# Patient Record
Sex: Male | Born: 1945
Health system: Southern US, Community
[De-identification: ages and names within clinical notes are randomized; demographics above are authoritative.]

## PROBLEM LIST (undated history)

## (undated) DIAGNOSIS — I219 Acute myocardial infarction, unspecified: Secondary | ICD-10-CM

## (undated) DIAGNOSIS — I2510 Atherosclerotic heart disease of native coronary artery without angina pectoris: Secondary | ICD-10-CM

## (undated) HISTORY — PX: CORONARY STENT PLACEMENT: SHX1402

## (undated) HISTORY — PX: CORONARY ANGIOPLASTY: SHX604

## (undated) HISTORY — PX: HERNIA REPAIR: SHX51

---

## 2010-01-24 ENCOUNTER — Ambulatory Visit: Payer: Self-pay | Admitting: Diagnostic Radiology

## 2010-01-24 ENCOUNTER — Emergency Department (HOSPITAL_BASED_OUTPATIENT_CLINIC_OR_DEPARTMENT_OTHER): Admission: EM | Admit: 2010-01-24 | Discharge: 2010-01-24 | Payer: Self-pay | Admitting: Emergency Medicine

## 2012-04-26 ENCOUNTER — Encounter (HOSPITAL_BASED_OUTPATIENT_CLINIC_OR_DEPARTMENT_OTHER): Payer: Self-pay | Admitting: Emergency Medicine

## 2012-04-26 ENCOUNTER — Emergency Department (HOSPITAL_BASED_OUTPATIENT_CLINIC_OR_DEPARTMENT_OTHER)
Admission: EM | Admit: 2012-04-26 | Discharge: 2012-04-26 | Disposition: A | Discharge status: Home / Self Care | Payer: BC Managed Care – PPO | Attending: Emergency Medicine | Admitting: Emergency Medicine

## 2012-04-26 DIAGNOSIS — Z9861 Coronary angioplasty status: Secondary | ICD-10-CM | POA: Insufficient documentation

## 2012-04-26 DIAGNOSIS — I252 Old myocardial infarction: Secondary | ICD-10-CM | POA: Insufficient documentation

## 2012-04-26 DIAGNOSIS — I2510 Atherosclerotic heart disease of native coronary artery without angina pectoris: Secondary | ICD-10-CM | POA: Insufficient documentation

## 2012-04-26 DIAGNOSIS — N390 Urinary tract infection, site not specified: Secondary | ICD-10-CM | POA: Insufficient documentation

## 2012-04-26 HISTORY — DX: Acute myocardial infarction, unspecified: I21.9

## 2012-04-26 HISTORY — DX: Atherosclerotic heart disease of native coronary artery without angina pectoris: I25.10

## 2012-04-26 LAB — URINALYSIS, ROUTINE W REFLEX MICROSCOPIC: Glucose, UA: NEGATIVE mg/dL

## 2012-04-26 LAB — URINE MICROSCOPIC-ADD ON

## 2012-04-26 MED ORDER — CIPROFLOXACIN HCL 500 MG PO TABS: 500.0000 mg | ORAL_TABLET | Freq: Two times a day (BID) | ORAL | Status: AC

## 2012-04-26 MED ORDER — CIPROFLOXACIN HCL 500 MG PO TABS
500.0000 mg | ORAL_TABLET | Freq: Once | ORAL | Status: AC
  Administered 2012-04-26: 500 mg via ORAL
  Filled 2012-04-26: qty 1

## 2012-04-26 NOTE — ED Provider Notes (Signed)
History     CSN: [id number]  Arrival date & time 04/26/12  4098   First MD Initiated Contact with Patient 04/26/12 [phone]      Chief Complaint  Patient presents with  . Urinary Tract Infection    (Consider location/radiation/quality/duration/timing/severity/associated sxs/prior treatment) HPI Comments: Patient presents with dysuria and urgency symptoms that began yesterday afternoon.  He states this is similar to when he had a prior urinary tract infection.  He denies any nausea or vomiting.  No fevers.  No abdominal pain.  Patient otherwise feels well.  Patient is a 66 y.o. male presenting with urinary tract infection. The history is provided by the patient. No language interpreter was used.  Urinary Tract Infection This is a new problem. Pertinent negatives include no chest pain, no abdominal pain, no headaches and no shortness of breath.    Past Medical History  Diagnosis Date  . Coronary artery disease   . Myocardial infarct     Past Surgical History  Procedure Date  . Coronary angioplasty   . Coronary stent placement     History reviewed. No pertinent family history.  History  Substance Use Topics  . Smoking status: Not on file  . Smokeless tobacco: Not on file  . Alcohol Use:       Review of Systems  Constitutional: Negative.  Negative for fever and chills.  HENT: Negative.   Respiratory: Negative.  Negative for shortness of breath.   Cardiovascular: Negative.  Negative for chest pain.  Gastrointestinal: Negative.  Negative for nausea, vomiting and abdominal pain.  Genitourinary: Positive for hematuria. Negative for flank pain.  Musculoskeletal: Negative.  Negative for back pain.  Skin: Negative.  Negative for color change and rash.  Neurological: Negative for headaches.  Hematological: Negative.  Negative for adenopathy.  Psychiatric/Behavioral: Negative.  Negative for confusion.  All other systems reviewed and are negative.    Allergies  Review of  patient's allergies indicates no known allergies.  Home Medications   Current Outpatient Rx  Name Route Sig Dispense Refill  . EZETIMIBE-SIMVASTATIN 10-10 MG PO TABS Oral Take 1 tablet by mouth at bedtime.    Marland Kitchen METOPROLOL SUCCINATE ER 50 MG PO TB24 Oral Take 50 mg by mouth daily. Take with or immediately following a meal.    . CIPROFLOXACIN HCL 500 MG PO TABS Oral Take 1 tablet (500 mg total) by mouth every 12 (twelve) hours. 28 tablet 0    BP 140/79  Pulse 68  Temp 97.7 F (36.5 C) (Oral)  Resp 16  SpO2 100%  Physical Exam  Nursing note and vitals reviewed. Constitutional: He is oriented to person, place, and time. He appears well-developed and well-nourished.  Non-toxic appearance. He does not have a sickly appearance.  HENT:  Head: Normocephalic and atraumatic.  Eyes: Conjunctivae, EOM and lids are normal. Pupils are equal, round, and reactive to light.  Neck: Trachea normal, normal range of motion and full passive range of motion without pain. Neck supple.  Cardiovascular: Normal rate.   Pulmonary/Chest: Effort normal.  Abdominal: Soft. Normal appearance. He exhibits no distension. There is no tenderness. There is no rebound and no CVA tenderness.  Musculoskeletal: Normal range of motion.  Neurological: He is alert and oriented to person, place, and time. He has normal strength.  Skin: Skin is warm, dry and intact. No rash noted.  Psychiatric: He has a normal mood and affect. His behavior is normal. Judgment and thought content normal.    ED Course  Procedures (including  critical care time)  Labs Reviewed  URINALYSIS, ROUTINE W REFLEX MICROSCOPIC - Abnormal; Notable for the following:    Color, Urine AMBER (*)  BIOCHEMICALS MAY BE AFFECTED BY COLOR   Hgb urine dipstick LARGE (*)     Protein, ur 100 (*)     Nitrite POSITIVE (*)     Leukocytes, UA SMALL (*)     All other components within normal limits  URINE MICROSCOPIC-ADD ON - Abnormal; Notable for the following:     Bacteria, UA MANY (*)     All other components within normal limits  URINE CULTURE   No results found.   1. UTI (urinary tract infection)       MDM  Patient with urinary tract infection symptoms consistent with his urinalysis today.  Patient is still able to urinate without difficulty.  He has normal vital signs and is tolerating by mouth intake.  The patient is safe for discharge home with followup with his primary care physician or urology in approximately one week.  Patient is comfortable with this plan.  He'll be discharged with ciprofloxacin 500 mg twice a day for 2 weeks.        Nat Christen, MD 04/26/12 [phone]

## 2012-04-26 NOTE — ED Notes (Signed)
Pt c/o burning urination since last night.  No known fever.

## 2012-04-29 LAB — URINE CULTURE: Colony Count: >=100000

## 2012-04-30 NOTE — ED Notes (Signed)
+   urine  Patient treated appropriately -sensitive to same-chart appended per protocol MD.  

## 2012-05-26 ENCOUNTER — Other Ambulatory Visit: Payer: Self-pay | Admitting: Urology

## 2012-05-26 DIAGNOSIS — N419 Inflammatory disease of prostate, unspecified: Secondary | ICD-10-CM

## 2012-05-28 ENCOUNTER — Ambulatory Visit
Admission: RE | Admit: 2012-05-28 | Discharge: 2012-05-28 | Disposition: A | Discharge status: Home / Self Care | Payer: BC Managed Care – PPO | Source: Ambulatory Visit | Attending: Urology | Admitting: Urology

## 2012-05-28 DIAGNOSIS — N419 Inflammatory disease of prostate, unspecified: Secondary | ICD-10-CM

## 2012-09-17 IMAGING — US US RENAL
1 series · 14 of 25 positions shown · non-contrast
Comparison: None

CLINICAL DATA: Recurrent prostatitis

RENAL/URINARY TRACT ULTRASOUND COMPLETE

[Series 1: us renal · 0.25mm/px · 14 of 29 slices shown]
[im 1/29]
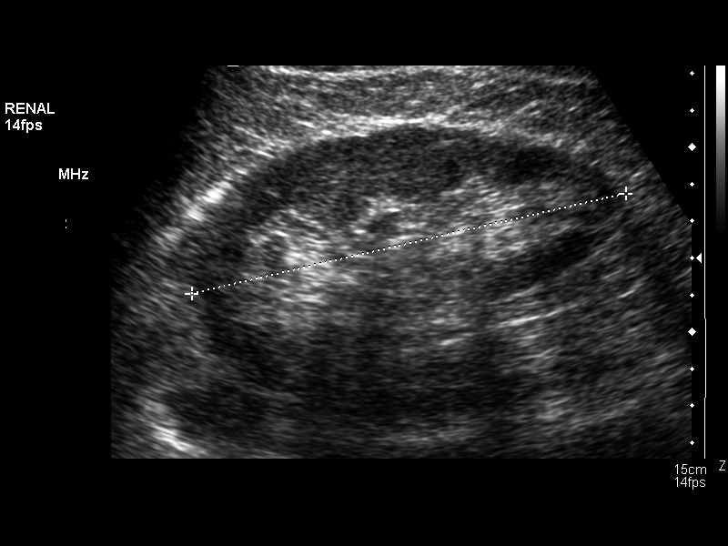
[im 3/29]
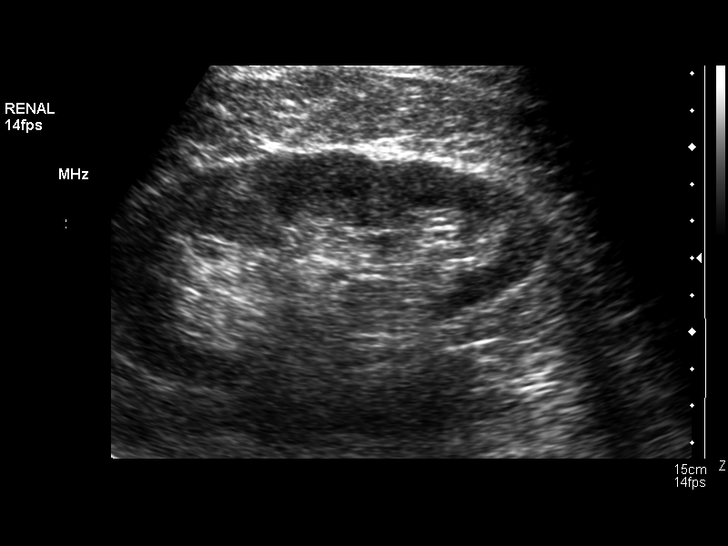
[im 5/29]
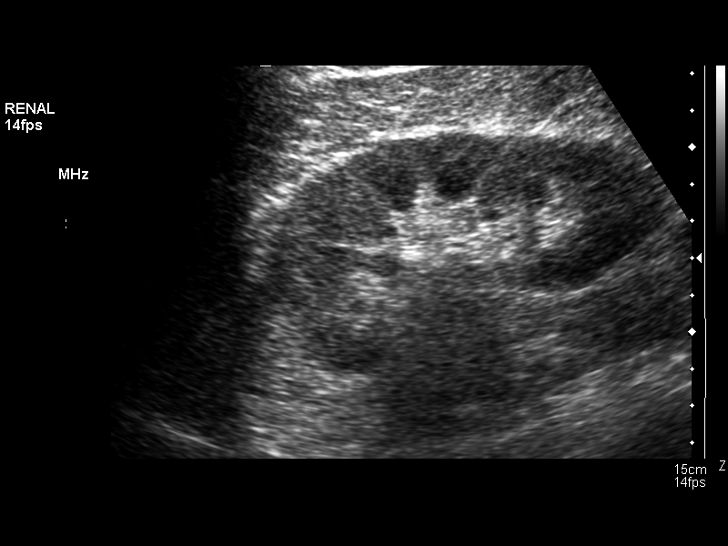
[im 8/29]
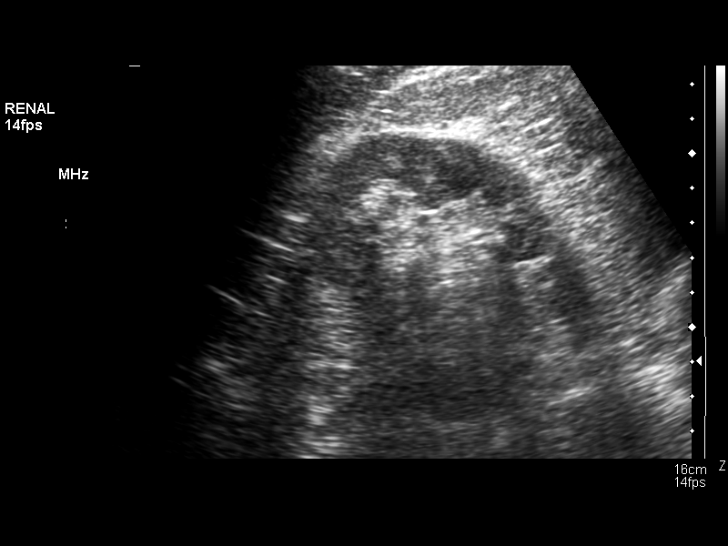
[im 10/29]
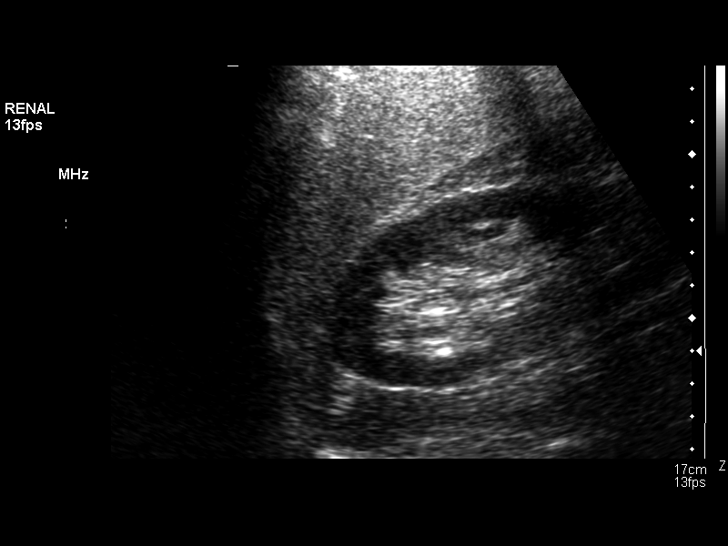
[im 11/29]
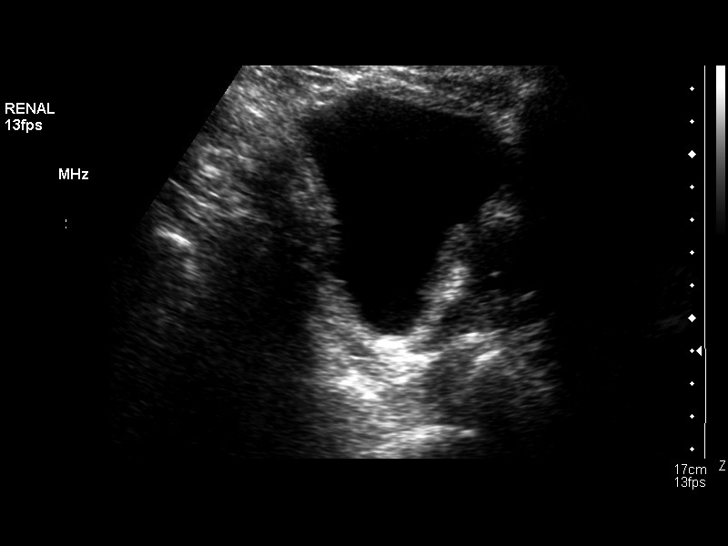
[im 13/29]
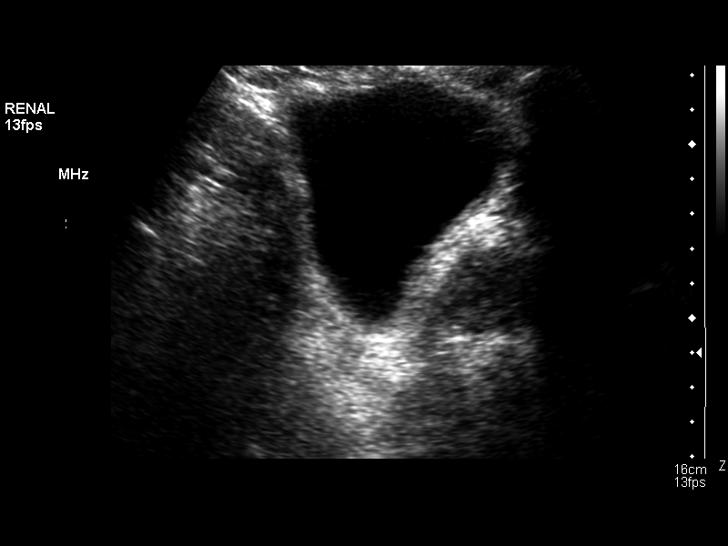
[im 16/29]
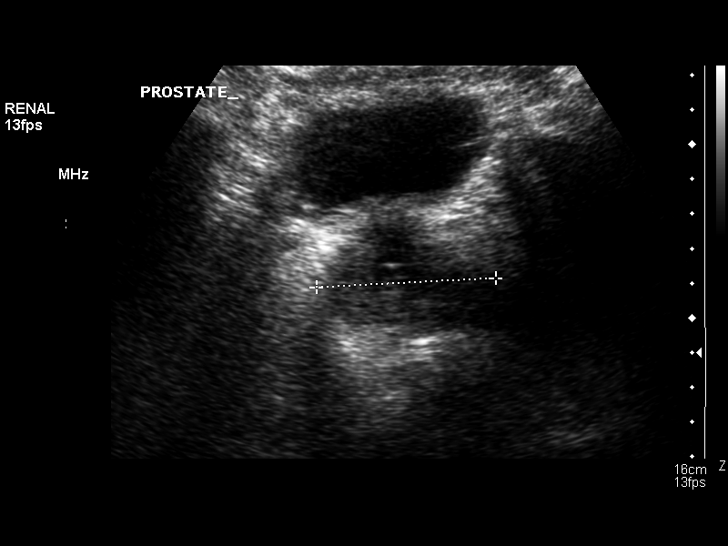
[im 18/29]
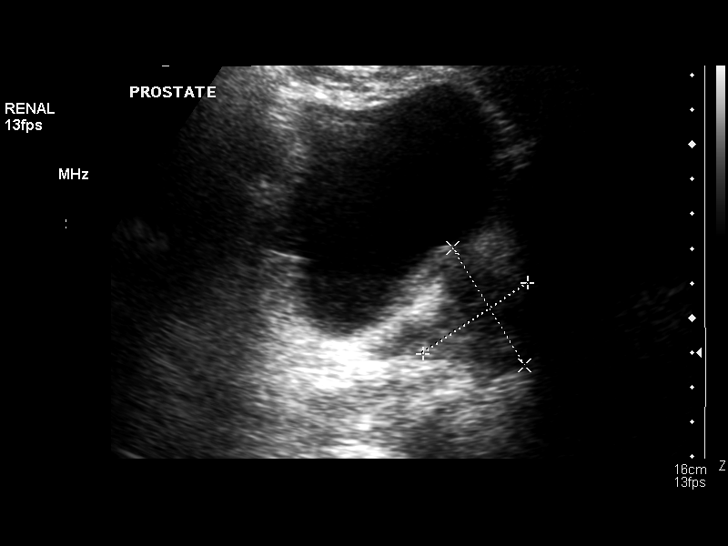
[im 19/29]
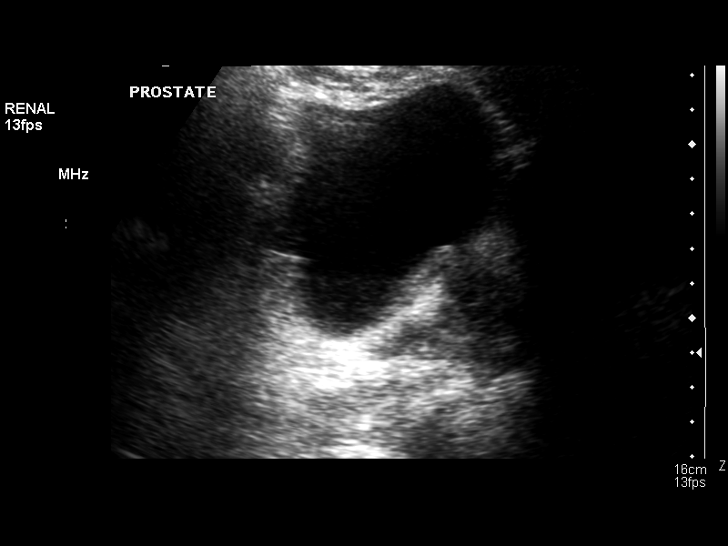
[im 22/29]
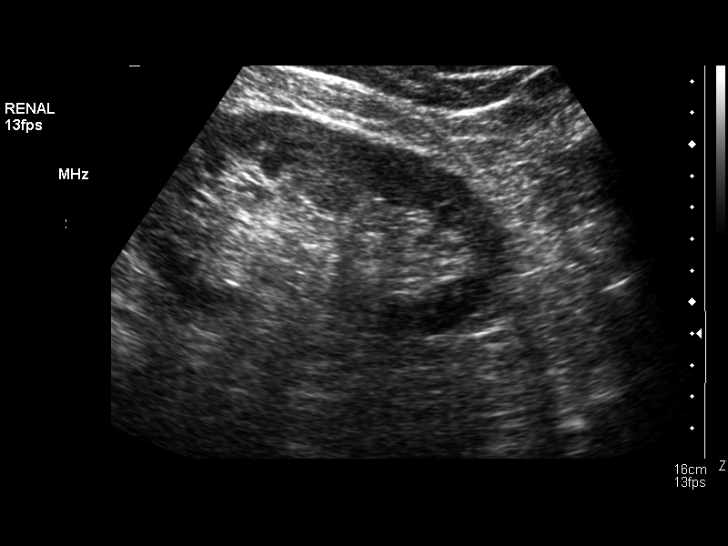
[im 24/29]
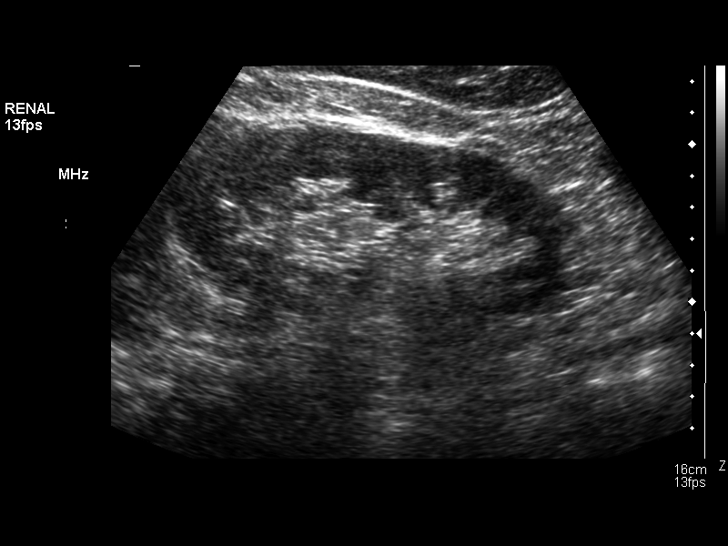
[im 26/29]
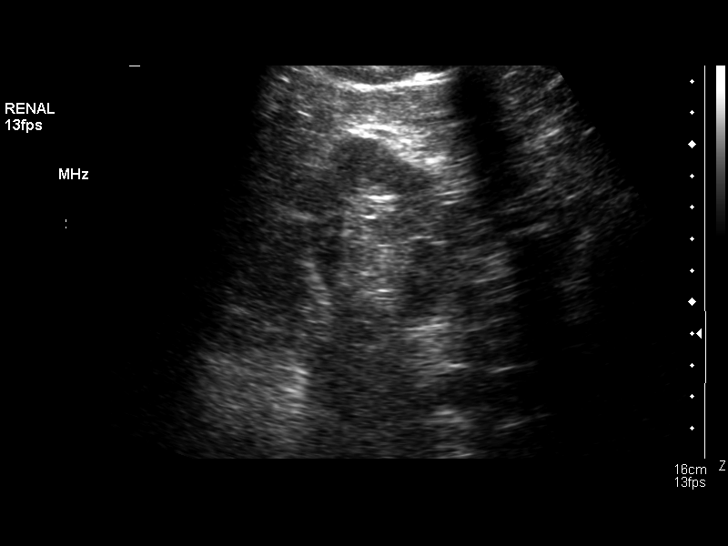
[im 29/29]
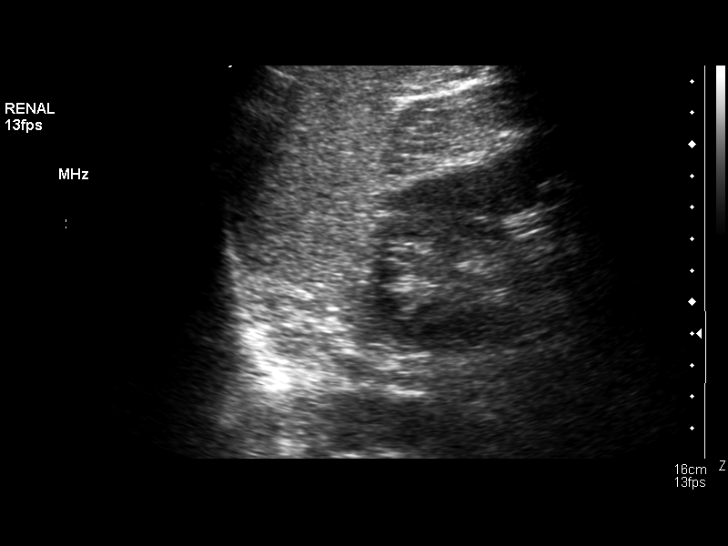

[14 of 25 positions shown; findings below may reference images not displayed]

FINDINGS: Right Kidney:  12.5 cm length.  Normal cortical thickness for age.
Upper normal cortical echogenicity.  No mass, hydronephrosis or
shadowing calcification.  No perinephric fluid.

Left Kidney:  12.6 cm length.  Normal cortical thickness for age.
Upper normal cortical echogenicity. No mass, hydronephrosis
shadowing calcifications.  No perinephric fluid.

Bladder:  Normal appearance.

Mild enlargement of prostate gland, 5.2 x 3.6 x 4.0 cm.
IMPRESSION: Mild prostatic enlargement.
No acute abnormalities.

## 2017-01-02 DIAGNOSIS — E785 Hyperlipidemia, unspecified: Secondary | ICD-10-CM | POA: Diagnosis not present

## 2017-10-22 ENCOUNTER — Other Ambulatory Visit: Payer: Self-pay

## 2017-10-22 ENCOUNTER — Encounter (HOSPITAL_BASED_OUTPATIENT_CLINIC_OR_DEPARTMENT_OTHER): Payer: Self-pay

## 2017-10-22 ENCOUNTER — Emergency Department (HOSPITAL_BASED_OUTPATIENT_CLINIC_OR_DEPARTMENT_OTHER)
Admission: EM | Admit: 2017-10-22 | Discharge: 2017-10-22 | Disposition: A | Discharge status: Home / Self Care | Payer: PPO | Attending: Emergency Medicine | Admitting: Emergency Medicine

## 2017-10-22 DIAGNOSIS — Z955 Presence of coronary angioplasty implant and graft: Secondary | ICD-10-CM | POA: Insufficient documentation

## 2017-10-22 DIAGNOSIS — K5900 Constipation, unspecified: Secondary | ICD-10-CM

## 2017-10-22 DIAGNOSIS — R109 Unspecified abdominal pain: Secondary | ICD-10-CM | POA: Diagnosis not present

## 2017-10-22 DIAGNOSIS — I252 Old myocardial infarction: Secondary | ICD-10-CM | POA: Insufficient documentation

## 2017-10-22 DIAGNOSIS — Z7982 Long term (current) use of aspirin: Secondary | ICD-10-CM | POA: Diagnosis not present

## 2017-10-22 DIAGNOSIS — I2510 Atherosclerotic heart disease of native coronary artery without angina pectoris: Secondary | ICD-10-CM | POA: Insufficient documentation

## 2017-10-22 DIAGNOSIS — Z87891 Personal history of nicotine dependence: Secondary | ICD-10-CM | POA: Diagnosis not present

## 2017-10-22 DIAGNOSIS — Z79899 Other long term (current) drug therapy: Secondary | ICD-10-CM | POA: Diagnosis not present

## 2017-10-22 DIAGNOSIS — K6289 Other specified diseases of anus and rectum: Secondary | ICD-10-CM

## 2017-10-22 LAB — CBC WITH DIFFERENTIAL/PLATELET
BASOS PCT: 0 %
Basophils Absolute: 0 10*3/uL (ref 0.0–0.1)
Eosinophils Absolute: 0.2 10*3/uL (ref 0.0–0.7)
Eosinophils Relative: 2 %
HEMATOCRIT: 46.2 % (ref 39.0–52.0)
Hemoglobin: 16.2 g/dL (ref 13.0–17.0)
Lymphocytes Relative: 14 %
Lymphs Abs: 1.4 10*3/uL (ref 0.7–4.0)
MCH: 31.9 pg (ref 26.0–34.0)
MCHC: 35.1 g/dL (ref 30.0–36.0)
MCV: 90.9 fL (ref 78.0–100.0)
MONO ABS: 0.7 10*3/uL (ref 0.1–1.0)
Monocytes Relative: 6 %
NEUTROS ABS: 8.1 10*3/uL — AB (ref 1.7–7.7)
NEUTROS PCT: 78 %
PLATELETS: 127 10*3/uL — AB (ref 150–400)
RBC: 5.08 MIL/uL (ref 4.22–5.81)
RDW: 12.6 % (ref 11.5–15.5)
WBC: 10.5 10*3/uL (ref 4.0–10.5)

## 2017-10-22 LAB — BASIC METABOLIC PANEL
Anion gap: 10 (ref 5–15)
BUN: 13 mg/dL (ref 6–20)
CALCIUM: 9.1 mg/dL (ref 8.9–10.3)
CO2: 23 mmol/L (ref 22–32)
Chloride: 103 mmol/L (ref 101–111)
Creatinine, Ser: 1.01 mg/dL (ref 0.61–1.24)
GFR calc non Af Amer: >60 mL/min (ref >60)
Glucose, Bld: 165 mg/dL — ABNORMAL HIGH (ref 65–99)
POTASSIUM: 3.7 mmol/L (ref 3.5–5.1)
Sodium: 136 mmol/L (ref 135–145)

## 2017-10-22 LAB — URINALYSIS, ROUTINE W REFLEX MICROSCOPIC
BILIRUBIN URINE: NEGATIVE
GLUCOSE, UA: 100 mg/dL — AB
HGB URINE DIPSTICK: NEGATIVE
Ketones, ur: NEGATIVE mg/dL
Leukocytes, UA: NEGATIVE
Nitrite: NEGATIVE
PH: 6.5 (ref 5.0–8.0)
Protein, ur: NEGATIVE mg/dL
SPECIFIC GRAVITY, URINE: 1.02 (ref 1.005–1.030)

## 2017-10-22 MED ORDER — MORPHINE SULFATE (PF) 4 MG/ML IV SOLN
4.0000 mg | Freq: Once | INTRAVENOUS | Status: AC
  Administered 2017-10-22: 4 mg via INTRAVENOUS
  Filled 2017-10-22: qty 1

## 2017-10-22 MED ORDER — MAGNESIUM CITRATE PO SOLN: 0.5000 | Freq: Once | ORAL | 0 refills | Status: AC

## 2017-10-22 MED ORDER — SODIUM CHLORIDE 0.9 % IV BOLUS (SEPSIS)
1000.0000 mL | Freq: Once | INTRAVENOUS | Status: AC
  Administered 2017-10-22: 1000 mL via INTRAVENOUS

## 2017-10-22 MED ORDER — FLEET ENEMA 7-19 GM/118ML RE ENEM
1.0000 | ENEMA | Freq: Once | RECTAL | Status: AC
  Administered 2017-10-22: 1 via RECTAL
  Filled 2017-10-22: qty 1

## 2017-10-22 MED ORDER — GLYCERIN (ADULT) 2 G RE SUPP: 1.0000 | RECTAL | 0 refills | Status: AC | PRN

## 2017-10-22 MED FILL — FLEET GLYCERIN ADULT SUPPOS: 2 | 24 days supply | Qty: 24 | Fill #0

## 2017-10-22 MED FILL — SM MAGNESIUM CITRATE 1.745: 1.745 | 2 days supply | Qty: 296 | Fill #0

## 2017-10-22 NOTE — ED Triage Notes (Addendum)
C/o "bowels impacted"-last BM 2-3 days ago-states he took miralax 30 min PTA-pt grimcaing-stood through part of triage stating pain worse when seated

## 2017-10-22 NOTE — ED Notes (Signed)
Pt was not able to move much stool after enema. Plan is to try I & O cath to decompress bladder before proceeding with next step.

## 2017-10-22 NOTE — ED Notes (Signed)
Pt was able to void on his own; EDP and EMT at bedside for stool disimpaction.

## 2017-10-22 NOTE — ED Notes (Signed)
Pt still unable to void

## 2017-10-22 NOTE — ED Notes (Signed)
Pt was able to use the urinal, so no in and out cath.

## 2017-10-22 NOTE — ED Provider Notes (Signed)
[facility] EMERGENCY DEPARTMENT Provider Note   CSN: [id number] Arrival date & time: 10/22/17  1124     History   Chief Complaint Chief Complaint  Patient presents with  . Constipation    HPI Mark Nielsen is a 72 y.o. male.  The history is provided by the patient.  Constipation   This is a new problem. The current episode started 2 days ago. The stool is described as pellet like. Associated symptoms include abdominal pain. Pertinent negatives include no dysuria. He has tried stimulants for the symptoms. The treatment provided no relief. His past medical history does not include endocrine disease or metabolic disease.   72 year old male with a history of coronary and constipation here complaining with no no BM for 2 days.  He states today he tried some MiraLAX without any relief.  He is experienced severe rectal pain after passing a few small stools and having difficulty urinating.  He denies any rectal bleeding.  There is some suprapubic discomfort due to his difficulty to not urinate.  He denies fevers chills nausea vomiting chest pain or shortness of breath.   Past Medical History:  Diagnosis Date  . Coronary artery disease   . Myocardial infarct (HCC)     There are no active problems to display for this patient.   Past Surgical History:  Procedure Laterality Date  . CORONARY ANGIOPLASTY    . CORONARY STENT PLACEMENT    . HERNIA REPAIR         Home Medications    Prior to Admission medications   Medication Sig Start Date End Date Taking? Authorizing Provider  aspirin 81 MG chewable tablet Chew by mouth daily.   Yes [provider]  ezetimibe-simvastatin (VYTORIN) 10-10 MG per tablet Take 1 tablet by mouth at bedtime.    [provider]  metoprolol succinate (TOPROL-XL) 50 MG 24 hr tablet Take 50 mg by mouth daily. Take with or immediately following a meal.    [provider]    Family History No family history on  file.  Social History Social History   Tobacco Use  . Smoking status: Former Games developer  . Smokeless tobacco: Never Used  Substance Use Topics  . Alcohol use: Yes    Comment: daily  . Drug use: No     Allergies   Patient has no known allergies.   Review of Systems Review of Systems  Constitutional: Negative for chills and fever.  HENT: Negative for ear pain and sore throat.   Eyes: Negative for pain and visual disturbance.  Respiratory: Negative for cough and shortness of breath.   Cardiovascular: Negative for chest pain and palpitations.  Gastrointestinal: Positive for abdominal pain and constipation. Negative for vomiting.  Genitourinary: Negative for dysuria and hematuria.  Musculoskeletal: Negative for arthralgias and back pain.  Skin: Negative for color change and rash.  Neurological: Negative for seizures and syncope.  All other systems reviewed and are negative.    Physical Exam Updated Vital Signs BP (!) 158/96 (BP Location: Left Arm)   Pulse 87   Temp 98.3 F (36.8 C) (Oral)   Resp 20   Ht 5\' 8"  (1.727 m)   Wt 88 kg (194 lb)   SpO2 100%   BMI 29.50 kg/m   Physical Exam  Constitutional: He appears well-developed and well-nourished.  HENT:  Head: Normocephalic and atraumatic.  Eyes: Conjunctivae are normal.  Neck: Neck supple.  Cardiovascular: Normal rate and regular rhythm.  No murmur heard. Pulmonary/Chest: Effort  normal and breath sounds normal. No respiratory distress.  Abdominal: Soft. There is no tenderness.  Musculoskeletal: He exhibits no edema.  Neurological: He is alert.  Skin: Skin is warm and dry.  Psychiatric: He has a normal mood and affect.  Nursing note and vitals reviewed.    ED Treatments / Results  Labs (all labs ordered are listed, but only abnormal results are displayed) Labs Reviewed - No data to display  EKG  EKG Interpretation None       Radiology No results found.  Procedures Fecal disimpaction Date/Time:  10/23/2017 12:21 PM Performed by: Terrilee FilesButler, Michael C, MD Authorized by: Terrilee FilesButler, Michael C, MD  Consent: Verbal consent obtained. Risks and benefits: risks, benefits and alternatives were discussed Consent given by: patient Patient understanding: patient states understanding of the procedure being performed Patient consent: the patient's understanding of the procedure matches consent given Procedure consent: procedure consent matches procedure scheduled Relevant documents: relevant documents present and verified Test results: test results available and properly labeled Site marked: the operative site was not marked Imaging studies: imaging studies not available Patient identity confirmed: verbally with patient Local anesthesia used: no  Anesthesia: Local anesthesia used: no  Sedation: Patient sedated: no  Patient tolerance: Patient tolerated the procedure well with no immediate complications    (including critical care time)  Medications Ordered in ED Medications  morphine 4 MG/ML injection 4 mg (not administered)  sodium chloride 0.9 % bolus 1,000 mL (not administered)     Initial Impression / Assessment and Plan / ED Course  I have reviewed the triage vital signs and the nursing notes.  Pertinent labs & imaging results that were available during my care of the patient were reviewed by me and considered in my medical decision making (see chart for details).  Clinical Course as of Oct 24 1219  Tue Oct 22, 2017  1514 2 attempts at disimpaction with some success.  Patient given fleets enema with minimal relief.  Patient was able to urinate on his own.  Disimpacted again and are trying a soapsuds enema.  [MB]    Clinical Course User Index [MB] Terrilee FilesButler, Michael C, MD      Final Clinical Impressions(s) / ED Diagnoses   Final diagnoses:  Constipation, unspecified constipation type  Rectal pain    ED Discharge Orders    None       Terrilee FilesButler, Michael C, MD 10/23/17  1222

## 2017-10-22 NOTE — Discharge Instructions (Signed)
Your evaluated in the emergency department for constipation and rectal pain.  You had some success with disimpaction and enemas.  We are prescribing you an oral laxative and a rectal suppository.  Continue to take a lot of fluids and Increase your fiber.  Return if worsening abdominal pain persistent vomiting or any other concerns.

## 2017-10-31 DIAGNOSIS — K5900 Constipation, unspecified: Secondary | ICD-10-CM | POA: Diagnosis not present

## 2017-10-31 DIAGNOSIS — Z2821 Immunization not carried out because of patient refusal: Secondary | ICD-10-CM | POA: Diagnosis not present

## 2017-10-31 DIAGNOSIS — I10 Essential (primary) hypertension: Secondary | ICD-10-CM | POA: Diagnosis not present

## 2018-05-23 DIAGNOSIS — I10 Essential (primary) hypertension: Secondary | ICD-10-CM | POA: Diagnosis not present

## 2018-05-23 DIAGNOSIS — Z7982 Long term (current) use of aspirin: Secondary | ICD-10-CM | POA: Diagnosis not present

## 2018-05-23 DIAGNOSIS — E7800 Pure hypercholesterolemia, unspecified: Secondary | ICD-10-CM | POA: Diagnosis not present

## 2018-05-23 DIAGNOSIS — I2510 Atherosclerotic heart disease of native coronary artery without angina pectoris: Secondary | ICD-10-CM | POA: Diagnosis not present

## 2018-05-23 DIAGNOSIS — Z79899 Other long term (current) drug therapy: Secondary | ICD-10-CM | POA: Diagnosis not present

## 2018-05-23 DIAGNOSIS — Z87891 Personal history of nicotine dependence: Secondary | ICD-10-CM | POA: Diagnosis not present

## 2018-08-31 DIAGNOSIS — S80812A Abrasion, left lower leg, initial encounter: Secondary | ICD-10-CM | POA: Diagnosis not present

## 2018-08-31 DIAGNOSIS — Z87891 Personal history of nicotine dependence: Secondary | ICD-10-CM | POA: Diagnosis not present

## 2018-08-31 DIAGNOSIS — W450XXA Nail entering through skin, initial encounter: Secondary | ICD-10-CM | POA: Diagnosis not present

## 2018-08-31 DIAGNOSIS — Z23 Encounter for immunization: Secondary | ICD-10-CM | POA: Diagnosis not present

## 2018-08-31 DIAGNOSIS — I10 Essential (primary) hypertension: Secondary | ICD-10-CM | POA: Diagnosis not present

## 2018-11-07 DIAGNOSIS — Z2821 Immunization not carried out because of patient refusal: Secondary | ICD-10-CM | POA: Diagnosis not present

## 2018-11-07 DIAGNOSIS — J0100 Acute maxillary sinusitis, unspecified: Secondary | ICD-10-CM | POA: Diagnosis not present

## 2018-12-09 DIAGNOSIS — M545 Low back pain: Secondary | ICD-10-CM | POA: Diagnosis not present

## 2018-12-09 DIAGNOSIS — M9905 Segmental and somatic dysfunction of pelvic region: Secondary | ICD-10-CM | POA: Diagnosis not present

## 2018-12-09 DIAGNOSIS — M9903 Segmental and somatic dysfunction of lumbar region: Secondary | ICD-10-CM | POA: Diagnosis not present

## 2018-12-11 DIAGNOSIS — R300 Dysuria: Secondary | ICD-10-CM | POA: Diagnosis not present

## 2018-12-11 DIAGNOSIS — N3000 Acute cystitis without hematuria: Secondary | ICD-10-CM | POA: Diagnosis not present

## 2019-06-16 DIAGNOSIS — I2510 Atherosclerotic heart disease of native coronary artery without angina pectoris: Secondary | ICD-10-CM | POA: Diagnosis not present

## 2019-06-16 DIAGNOSIS — Z79899 Other long term (current) drug therapy: Secondary | ICD-10-CM | POA: Diagnosis not present

## 2019-06-16 DIAGNOSIS — E785 Hyperlipidemia, unspecified: Secondary | ICD-10-CM | POA: Diagnosis not present

## 2019-06-16 DIAGNOSIS — I10 Essential (primary) hypertension: Secondary | ICD-10-CM | POA: Diagnosis not present

## 2019-10-06 DIAGNOSIS — I2510 Atherosclerotic heart disease of native coronary artery without angina pectoris: Secondary | ICD-10-CM | POA: Diagnosis not present

## 2020-03-11 DIAGNOSIS — S30861A Insect bite (nonvenomous) of abdominal wall, initial encounter: Secondary | ICD-10-CM | POA: Diagnosis not present

## 2020-03-11 DIAGNOSIS — W57XXXA Bitten or stung by nonvenomous insect and other nonvenomous arthropods, initial encounter: Secondary | ICD-10-CM | POA: Diagnosis not present

## 2020-06-21 DIAGNOSIS — I10 Essential (primary) hypertension: Secondary | ICD-10-CM | POA: Diagnosis not present

## 2020-06-21 DIAGNOSIS — I2510 Atherosclerotic heart disease of native coronary artery without angina pectoris: Secondary | ICD-10-CM | POA: Diagnosis not present

## 2020-06-21 DIAGNOSIS — R0600 Dyspnea, unspecified: Secondary | ICD-10-CM | POA: Diagnosis not present

## 2020-06-21 DIAGNOSIS — E785 Hyperlipidemia, unspecified: Secondary | ICD-10-CM | POA: Diagnosis not present

## 2024-05-10 ENCOUNTER — Emergency Department (HOSPITAL_BASED_OUTPATIENT_CLINIC_OR_DEPARTMENT_OTHER)
Admission: EM | Admit: 2024-05-10 | Discharge: 2024-05-10 | Disposition: A | Discharge status: Home / Self Care | Attending: Emergency Medicine | Admitting: Emergency Medicine

## 2024-05-10 ENCOUNTER — Emergency Department (HOSPITAL_BASED_OUTPATIENT_CLINIC_OR_DEPARTMENT_OTHER)

## 2024-05-10 ENCOUNTER — Other Ambulatory Visit: Payer: Self-pay

## 2024-05-10 ENCOUNTER — Encounter (HOSPITAL_BASED_OUTPATIENT_CLINIC_OR_DEPARTMENT_OTHER): Payer: Self-pay | Admitting: Emergency Medicine

## 2024-05-10 DIAGNOSIS — E785 Hyperlipidemia, unspecified: Secondary | ICD-10-CM | POA: Diagnosis not present

## 2024-05-10 DIAGNOSIS — R410 Disorientation, unspecified: Secondary | ICD-10-CM | POA: Diagnosis present

## 2024-05-10 DIAGNOSIS — Z87891 Personal history of nicotine dependence: Secondary | ICD-10-CM | POA: Insufficient documentation

## 2024-05-10 DIAGNOSIS — Z79899 Other long term (current) drug therapy: Secondary | ICD-10-CM | POA: Insufficient documentation

## 2024-05-10 DIAGNOSIS — Z7982 Long term (current) use of aspirin: Secondary | ICD-10-CM | POA: Diagnosis not present

## 2024-05-10 DIAGNOSIS — I2510 Atherosclerotic heart disease of native coronary artery without angina pectoris: Secondary | ICD-10-CM | POA: Diagnosis not present

## 2024-05-10 LAB — COMPREHENSIVE METABOLIC PANEL WITH GFR
ALT: 18 U/L (ref 0–44)
AST: 18 U/L (ref 15–41)
Albumin: 4.5 g/dL (ref 3.5–5.0)
Alkaline Phosphatase: 57 U/L (ref 38–126)
Anion gap: 12 (ref 5–15)
BUN: 14 mg/dL (ref 8–23)
CO2: 25 mmol/L (ref 22–32)
Calcium: 9 mg/dL (ref 8.9–10.3)
Chloride: 107 mmol/L (ref 98–111)
Creatinine, Ser: 1.18 mg/dL (ref 0.61–1.24)
GFR, Estimated: >60 mL/min (ref >60)
Glucose, Bld: 128 mg/dL — ABNORMAL HIGH (ref 70–99)
Potassium: 3.7 mmol/L (ref 3.5–5.1)
Sodium: 144 mmol/L (ref 135–145)
Total Bilirubin: 1.7 mg/dL — ABNORMAL HIGH (ref 0.0–1.2)
Total Protein: 7 g/dL (ref 6.5–8.1)

## 2024-05-10 LAB — URINE DRUG SCREEN
Amphetamines: NOT DETECTED
Barbiturates: NOT DETECTED
Benzodiazepines: NOT DETECTED
Cocaine: NOT DETECTED
Fentanyl: NOT DETECTED
Methadone Scn, Ur: NOT DETECTED
Opiates: NOT DETECTED
Tetrahydrocannabinol: NOT DETECTED

## 2024-05-10 LAB — URINALYSIS, ROUTINE W REFLEX MICROSCOPIC
Bilirubin Urine: NEGATIVE
Glucose, UA: NEGATIVE mg/dL
Hgb urine dipstick: NEGATIVE
Ketones, ur: NEGATIVE mg/dL
Leukocytes,Ua: NEGATIVE
Nitrite: NEGATIVE
Protein, ur: NEGATIVE mg/dL
Specific Gravity, Urine: 1.02 (ref 1.005–1.030)
pH: 6 (ref 5.0–8.0)

## 2024-05-10 LAB — CBC
HCT: 40.3 % (ref 39.0–52.0)
Hemoglobin: 14.1 g/dL (ref 13.0–17.0)
MCH: 32.2 pg (ref 26.0–34.0)
MCHC: 35 g/dL (ref 30.0–36.0)
MCV: 92 fL (ref 80.0–100.0)
Platelets: 127 K/uL — ABNORMAL LOW (ref 150–400)
RBC: 4.38 MIL/uL (ref 4.22–5.81)
RDW: 12.7 % (ref 11.5–15.5)
WBC: 9.1 K/uL (ref 4.0–10.5)
nRBC: 0 % (ref 0.0–0.2)

## 2024-05-10 LAB — CBG MONITORING, ED: Glucose-Capillary: 137 mg/dL — ABNORMAL HIGH (ref 70–99)

## 2024-05-10 LAB — AMMONIA: Ammonia: <13 umol/L (ref 9–35)

## 2024-05-10 LAB — VITAMIN B12: Vitamin B-12: 244 pg/mL (ref 180–914)

## 2024-05-10 LAB — ETHANOL: Alcohol, Ethyl (B): <15 mg/dL (ref <15)

## 2024-05-10 MED ORDER — VITAMIN B-12 100 MCG PO TABS: 100.0000 ug | ORAL_TABLET | Freq: Every day | ORAL | 0 refills | Status: AC

## 2024-05-10 MED ORDER — THIAMINE HCL 100 MG PO TABS: 100.0000 mg | ORAL_TABLET | Freq: Every day | ORAL | 0 refills | Status: AC

## 2024-05-10 MED ORDER — FOLIC ACID 1 MG PO TABS: 1.0000 mg | ORAL_TABLET | Freq: Every day | ORAL | 0 refills | Status: AC

## 2024-05-10 NOTE — ED Triage Notes (Signed)
 Pt reports progressive BLE weakness since May, also some ongoing intermittent confusion, denies CP, SHoB, vision, speech or sensation changes  NIH assessment done, WNL

## 2024-05-10 NOTE — Discharge Instructions (Addendum)
 While you were in the emergency room, your head CT that was normal.  Your blood work that was done today was also normal.  At this time, I do not a clear cause for the symptoms that you are describing.  Sometimes, some of the symptoms can be caused by some deficiencies in some vitamins.  I have sent you prescriptions for folic acid , thiamine  and vitamin B12.  Please follow-up with your primary care doctor this week.

## 2024-05-10 NOTE — ED Notes (Signed)
 Ct made aware patient ready for CT

## 2024-05-10 NOTE — ED Provider Notes (Signed)
 [place] EMERGENCY DEPARTMENT AT [facility] Provider Note  CSN: [id number] Arrival date & time: 05/10/24 1430  Chief Complaint(s) Weakness  HPI Mark Nielsen is a 78 y.o. male who is here today with his wife because over the last several months, he has seemed more confused.  It came to a head today when the patient did not recognize that the patient's wife was his wife.  He had a regular checkup in May, was told that everything was overall normal aside from a mildly elevated bilirubin.  He has a history of hyperlipidemia, coronary artery disease.  Takes a statin and a beta-blocker.  No infectious symptoms, no falls.  Wife reports the patient drinks between 1-2 beers per day.   Past Medical History Past Medical History:  Diagnosis Date   Coronary artery disease    Myocardial infarct (HCC)    There are no active problems to display for this patient.  Home Medication(s) Prior to Admission medications   Medication Sig Start Date End Date Taking? Authorizing Provider  folic acid  (FOLVITE ) 1 MG tablet Take 1 tablet (1 mg total) by mouth daily for 7 days. 05/10/24 05/17/24 Yes Mannie Pac T, DO  thiamine  (VITAMIN B1) 100 MG tablet Take 1 tablet (100 mg total) by mouth daily for 7 days. 05/10/24 05/17/24 Yes Mannie Pac T, DO  vitamin B-12 (CYANOCOBALAMIN ) 100 MCG tablet Take 1 tablet (100 mcg total) by mouth daily for 7 days. 05/10/24 05/17/24 Yes Mannie Pac T, DO  aspirin 81 MG chewable tablet Chew by mouth daily.    [provider]  ezetimibe-simvastatin (VYTORIN) 10-10 MG per tablet Take 1 tablet by mouth at bedtime.    [provider]  glycerin  adult 2 g suppository Place 1 suppository rectally as needed for constipation. 10/22/17   Butler, Michael C, MD  metoprolol succinate (TOPROL-XL) 50 MG 24 hr tablet Take 50 mg by mouth daily. Take with or immediately following a meal.    [provider]                                                                                                                                     Past Surgical History Past Surgical History:  Procedure Laterality Date   CORONARY ANGIOPLASTY     CORONARY STENT PLACEMENT     HERNIA REPAIR     Family History History reviewed. No pertinent family history.  Social History Social History   Tobacco Use   Smoking status: Former   Smokeless tobacco: Never  Advertising account planner   Vaping status: Never Used  Substance Use Topics   Alcohol use: Yes    Comment: daily   Drug use: No   Allergies Patient has no known allergies.  Review of Systems Review of Systems  Physical Exam Vital Signs  I have reviewed the triage vital signs BP (!) 168/82   Pulse 78   Temp 97.7 F (36.5  C) (Oral)   Resp 16   Ht 5' 8 (1.727 m)   Wt 79.8 kg   SpO2 98%   BMI 26.76 kg/m   Physical Exam Vitals and nursing note reviewed.  HENT:     Head: Normocephalic.  Eyes:     Pupils: Pupils are equal, round, and reactive to light.  Cardiovascular:     Rate and Rhythm: Normal rate.  Pulmonary:     Effort: Pulmonary effort is normal.  Abdominal:     General: Abdomen is flat. There is no distension.     Tenderness: There is no abdominal tenderness.  Musculoskeletal:        General: Normal range of motion.     Cervical back: Normal range of motion.  Neurological:     General: No focal deficit present.     Mental Status: He is alert.     Cranial Nerves: No cranial nerve deficit.     Motor: No weakness.     Gait: Gait normal.     Comments: Patient alert and oriented to self, place, event, date, can name the president.     ED Results and Treatments Labs (all labs ordered are listed, but only abnormal results are displayed) Labs Reviewed  COMPREHENSIVE METABOLIC PANEL WITH GFR - Abnormal; Notable for the following components:      Result Value   Glucose, Bld 128 (*)    Total Bilirubin 1.7 (*)    All other components within normal limits  CBC - Abnormal; Notable for  the following components:   Platelets 127 (*)    All other components within normal limits  CBG MONITORING, ED - Abnormal; Notable for the following components:   Glucose-Capillary 137 (*)    All other components within normal limits  URINALYSIS, ROUTINE W REFLEX MICROSCOPIC  ETHANOL  AMMONIA  TSH  URINE DRUG SCREEN  VITAMIN B1  VITAMIN B12  CBG MONITORING, ED                                                                                                                          Radiology CT Head Wo Contrast Result Date: 05/10/2024 CLINICAL DATA:  Delirium. EXAM: CT HEAD WITHOUT CONTRAST TECHNIQUE: Contiguous axial images were obtained from the base of the skull through the vertex without intravenous contrast. RADIATION DOSE REDUCTION: This exam was performed according to the departmental dose-optimization program which includes automated exposure control, adjustment of the mA and/or kV according to patient size and/or use of iterative reconstruction technique. COMPARISON:  None Available. FINDINGS: Brain: Ventricles, cisterns and other CSF spaces are normal. There is no mass, mass effect, shift of midline structures or acute hemorrhage. No evidence of acute infarction. Vascular: No hyperdense vessel or unexpected calcification. Skull: Normal. Negative for fracture or focal lesion. Sinuses/Orbits: Orbits are normal symmetric. Moderate opacification over the ethmoid air cells and frontoethmoidal recesses. Moderate mucosal membrane thickening involving the right maxillary sinus with small air-fluid level seen over the left maxillary  sinus. Evidence of previous sinus surgery with fenestration medial wall of the right maxillary sinus. Mastoid air cells are clear. Other: None. IMPRESSION: 1. No acute brain injury. 2. 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 Electronically Signed   By: Toribio Agreste M.D.   On: 05/10/2024 16:03    Pertinent labs & imaging  results that were available during my care of the patient were reviewed by me and considered in my medical decision making (see MDM for details).  Medications Ordered in ED Medications - No data to display                                                                                                                                   Procedures Procedures  (including critical care time)  Medical Decision Making / ED Course   This patient presents to the ED for concern of confusion, this involves an extensive number of treatment options, and is a complaint that carries with it a high risk of complications and morbidity.  The differential diagnosis includes electrolyte abnormalities, dehydration, CVA, vitamin deficiency, history of alcohol use, dementia, normal pressure of syphilis.  MDM: Patient is a bit ornery.  When I enter the room with him to get the history, patient tells me that he does not want to be here.  He is angry with his wife for making him come to the hospital today.  Patient is able to answer questions appropriately for me, however at this time as though he has been having worsening confusion over the last several months.  Will check blood work on the patient, obtain imaging of the patient's head.  I do not appreciate any neurological deficits on the patient's exam.  Reassessment 4:45 PM-patient's head CT negative.  His blood work overall normal.  Vitamins will not come back today, normal TSH.  I discussed this with the patient and the patient's wife.  They agree with discharge with B12, folic acid  and thiamine  supplementation.  They will follow-up with their primary care doctor this week.   Additional history obtained: -Additional history obtained from wife at bedside -External records from outside source obtained and reviewed including: Chart review including previous notes, labs, imaging, consultation notes   Lab Tests: -I ordered, reviewed, and interpreted labs.   The  pertinent results include:   Labs Reviewed  COMPREHENSIVE METABOLIC PANEL WITH GFR - Abnormal; Notable for the following components:      Result Value   Glucose, Bld 128 (*)    Total Bilirubin 1.7 (*)    All other components within normal limits  CBC - Abnormal; Notable for the following components:   Platelets 127 (*)    All other components within normal limits  CBG MONITORING, ED - Abnormal; Notable for the following components:   Glucose-Capillary 137 (*)    All other components within normal limits  URINALYSIS, ROUTINE W REFLEX MICROSCOPIC  ETHANOL  AMMONIA  TSH  URINE DRUG SCREEN  VITAMIN B1  VITAMIN B12  CBG MONITORING, ED      EKG my independent review of the patient's EKG shows no ST segment depressions or elevations, no T wave inversions, no evidence of acute ischemia.  EKG Interpretation Date/Time:  Sunday May 10 2024 14:41:48 EDT Ventricular Rate:  76 PR Interval:  25 QRS Duration:  90 QT Interval:  391 QTC Calculation: 440 R Axis:   33  Text Interpretation: Sinus rhythm Short PR interval Confirmed by Mannie Pac [phone]) on 05/10/2024 4:29:29 PM         Imaging Studies ordered: I ordered imaging studies including CT head I independently visualized and interpreted imaging. I agree with the radiologist interpretation   Medicines ordered and prescription drug management: Meds ordered this encounter  Medications   thiamine  (VITAMIN B1) 100 MG tablet    Sig: Take 1 tablet (100 mg total) by mouth daily for 7 days.    Dispense:  7 tablet    Refill:  0   vitamin B-12 (CYANOCOBALAMIN ) 100 MCG tablet    Sig: Take 1 tablet (100 mcg total) by mouth daily for 7 days.    Dispense:  7 tablet    Refill:  0   folic acid  (FOLVITE ) 1 MG tablet    Sig: Take 1 tablet (1 mg total) by mouth daily for 7 days.    Dispense:  7 tablet    Refill:  0    -I have reviewed the patients home medicines and have made adjustments as needed  Cardiac Monitoring: The  patient was maintained on a cardiac monitor.  I personally viewed and interpreted the cardiac monitored which showed an underlying rhythm of: Normal sinus rhythm  Social Determinants of Health:  Factors impacting patients care include: Lack of access to primary care   Reevaluation: After the interventions noted above, I reevaluated the patient and found that they have :improved  Co morbidities that complicate the patient evaluation  Past Medical History:  Diagnosis Date   Coronary artery disease    Myocardial infarct (HCC)       Dispostion: I considered admission for this patient, however with his reassuring workup, he is appropriate for outpatient workup.     Final Clinical Impression(s) / ED Diagnoses Final diagnoses:  Confusion     @PCDICTATION @    Mannie Pac T, DO 05/10/24 1651

## 2024-05-11 ENCOUNTER — Encounter (HOSPITAL_BASED_OUTPATIENT_CLINIC_OR_DEPARTMENT_OTHER): Payer: Self-pay

## 2024-05-11 ENCOUNTER — Emergency Department (HOSPITAL_BASED_OUTPATIENT_CLINIC_OR_DEPARTMENT_OTHER)
Admission: EM | Admit: 2024-05-11 | Discharge: 2024-05-11 | Disposition: A | Discharge status: Home / Self Care | Attending: Emergency Medicine | Admitting: Emergency Medicine

## 2024-05-11 ENCOUNTER — Other Ambulatory Visit: Payer: Self-pay

## 2024-05-11 DIAGNOSIS — Z7982 Long term (current) use of aspirin: Secondary | ICD-10-CM | POA: Diagnosis not present

## 2024-05-11 DIAGNOSIS — R410 Disorientation, unspecified: Secondary | ICD-10-CM | POA: Insufficient documentation

## 2024-05-11 DIAGNOSIS — Z87891 Personal history of nicotine dependence: Secondary | ICD-10-CM | POA: Insufficient documentation

## 2024-05-11 DIAGNOSIS — I2510 Atherosclerotic heart disease of native coronary artery without angina pectoris: Secondary | ICD-10-CM | POA: Insufficient documentation

## 2024-05-11 DIAGNOSIS — Z79899 Other long term (current) drug therapy: Secondary | ICD-10-CM | POA: Diagnosis not present

## 2024-05-11 LAB — URINE DRUG SCREEN
Amphetamines: NOT DETECTED
Barbiturates: NOT DETECTED
Benzodiazepines: NOT DETECTED
Cocaine: NOT DETECTED
Fentanyl: NOT DETECTED
Methadone Scn, Ur: NOT DETECTED
Opiates: NOT DETECTED
Tetrahydrocannabinol: NOT DETECTED

## 2024-05-11 LAB — COMPREHENSIVE METABOLIC PANEL WITH GFR
ALT: 18 U/L (ref 0–44)
AST: 20 U/L (ref 15–41)
Albumin: 4.6 g/dL (ref 3.5–5.0)
Alkaline Phosphatase: 65 U/L (ref 38–126)
Anion gap: 14 (ref 5–15)
BUN: 15 mg/dL (ref 8–23)
CO2: 23 mmol/L (ref 22–32)
Calcium: 9.3 mg/dL (ref 8.9–10.3)
Chloride: 105 mmol/L (ref 98–111)
Creatinine, Ser: 1.24 mg/dL (ref 0.61–1.24)
GFR, Estimated: >60 mL/min (ref >60)
Glucose, Bld: 115 mg/dL — ABNORMAL HIGH (ref 70–99)
Potassium: 3.9 mmol/L (ref 3.5–5.1)
Sodium: 142 mmol/L (ref 135–145)
Total Bilirubin: 1.9 mg/dL — ABNORMAL HIGH (ref 0.0–1.2)
Total Protein: 7.2 g/dL (ref 6.5–8.1)

## 2024-05-11 LAB — CBC WITH DIFFERENTIAL/PLATELET
Abs Immature Granulocytes: 0.03 K/uL (ref 0.00–0.07)
Basophils Absolute: 0.1 K/uL (ref 0.0–0.1)
Basophils Relative: 1 %
Eosinophils Absolute: 0.3 K/uL (ref 0.0–0.5)
Eosinophils Relative: 3 %
HCT: 40.1 % (ref 39.0–52.0)
Hemoglobin: 14.3 g/dL (ref 13.0–17.0)
Immature Granulocytes: 0 %
Lymphocytes Relative: 15 %
Lymphs Abs: 1.7 K/uL (ref 0.7–4.0)
MCH: 32.7 pg (ref 26.0–34.0)
MCHC: 35.7 g/dL (ref 30.0–36.0)
MCV: 91.8 fL (ref 80.0–100.0)
Monocytes Absolute: 0.7 K/uL (ref 0.1–1.0)
Monocytes Relative: 6 %
Neutro Abs: 8.1 K/uL — ABNORMAL HIGH (ref 1.7–7.7)
Neutrophils Relative %: 75 %
Platelets: 138 K/uL — ABNORMAL LOW (ref 150–400)
RBC: 4.37 MIL/uL (ref 4.22–5.81)
RDW: 12.8 % (ref 11.5–15.5)
WBC: 10.9 K/uL — ABNORMAL HIGH (ref 4.0–10.5)
nRBC: 0 % (ref 0.0–0.2)

## 2024-05-11 LAB — ACETAMINOPHEN LEVEL: Acetaminophen (Tylenol), Serum: <10 ug/mL — ABNORMAL LOW (ref 10–30)

## 2024-05-11 LAB — URINALYSIS, ROUTINE W REFLEX MICROSCOPIC
Bilirubin Urine: NEGATIVE
Glucose, UA: NEGATIVE mg/dL
Hgb urine dipstick: NEGATIVE
Ketones, ur: NEGATIVE mg/dL
Leukocytes,Ua: NEGATIVE
Nitrite: NEGATIVE
Protein, ur: NEGATIVE mg/dL
Specific Gravity, Urine: 1.015 (ref 1.005–1.030)
pH: 6.5 (ref 5.0–8.0)

## 2024-05-11 LAB — RESP PANEL BY RT-PCR (RSV, FLU A&B, COVID)  RVPGX2
Influenza A by PCR: NEGATIVE
Influenza B by PCR: NEGATIVE
Resp Syncytial Virus by PCR: NEGATIVE
SARS Coronavirus 2 by RT PCR: NEGATIVE

## 2024-05-11 LAB — CBG MONITORING, ED: Glucose-Capillary: 124 mg/dL — ABNORMAL HIGH (ref 70–99)

## 2024-05-11 LAB — SALICYLATE LEVEL: Salicylate Lvl: <7 mg/dL — ABNORMAL LOW (ref 7.0–30.0)

## 2024-05-11 LAB — CK: Total CK: 69 U/L (ref 49–397)

## 2024-05-11 LAB — TSH: TSH: 1.006 u[IU]/mL (ref 0.350–4.500)

## 2024-05-11 LAB — AMMONIA: Ammonia: 22 umol/L (ref 9–35)

## 2024-05-11 NOTE — ED Provider Notes (Signed)
 [place] [facility] Provider Note  CSN: [id number] Arrival date & time: 05/11/24 1735  Chief Complaint(s) Altered Mental Status  HPI Mark Nielsen is a 78 y.o. male with past medical history as below, significant for CAD, prior MI who presents to the ED with complaint of confusion  Patient is here with spouse for second episode of confusion the past 24 hours.  He was seen in the ER yesterday under similar circumstances.  Patient had transient episode of confusion regarding his spouse.  He thought a stranger was in his house he did not recognize her.  The symptoms lasted for an hour or 2 and then subsided.  Workup yesterday was reassuring and they advised follow-up with her PCP in the office.  Patient and spouse return again today for repeat episode and were advised to return to the ER from PCPs office.  Spouse reports episode began today around 2 PM and lasted till around 4 to 4:30 PM she was attempting to give him his medication when he became confused, he thought she was trying to give him an abnormal substance that he did not want he did not know who his wife was.  Wife reports that he was only confused regarding her and other objects in her home.  He was ambulatory at that time without any gait dysfunction, he called a friend on the phone to complain that there was an unusual person in his home.  Spouse reports that patient is now back to normal, he denies any acute complaints at this time is requesting to leave.  He has somewhat spotty recollection of the episode and reports that he knows it happened because his wife told him it happened not specifically because he can actually remember what happened     Past Medical History Past Medical History:  Diagnosis Date   Coronary artery disease    Myocardial infarct (HCC)    There are no active problems to display for this patient.  Home Medication(s) Prior to Admission medications   Medication Sig  Start Date End Date Taking? Authorizing Provider  aspirin 81 MG chewable tablet Chew by mouth daily.    [provider]  ezetimibe-simvastatin (VYTORIN) 10-10 MG per tablet Take 1 tablet by mouth at bedtime.    [provider]  folic acid  (FOLVITE ) 1 MG tablet Take 1 tablet (1 mg total) by mouth daily for 7 days. 05/10/24 05/17/24  Mannie Pac T, DO  glycerin  adult 2 g suppository Place 1 suppository rectally as needed for constipation. 10/22/17   Butler, Michael C, MD  metoprolol succinate (TOPROL-XL) 50 MG 24 hr tablet Take 50 mg by mouth daily. Take with or immediately following a meal.    [provider]  thiamine  (VITAMIN B1) 100 MG tablet Take 1 tablet (100 mg total) by mouth daily for 7 days. 05/10/24 05/17/24  Mannie Pac T, DO  vitamin B-12 (CYANOCOBALAMIN ) 100 MCG tablet Take 1 tablet (100 mcg total) by mouth daily for 7 days. 05/10/24 05/17/24  Mannie Pac DASEN, DO  Past Surgical History Past Surgical History:  Procedure Laterality Date   CORONARY ANGIOPLASTY     CORONARY STENT PLACEMENT     HERNIA REPAIR     Family History History reviewed. No pertinent family history.  Social History Social History   Tobacco Use   Smoking status: Former   Smokeless tobacco: Never  Advertising account planner   Vaping status: Never Used  Substance Use Topics   Alcohol use: Yes    Comment: daily   Drug use: No   Allergies Patient has no known allergies.  Review of Systems A thorough review of systems was obtained and all systems are negative except as noted in the HPI and PMH.   Physical Exam Vital Signs  I have reviewed the triage vital signs BP (!) 145/81   Pulse 66   Temp 97.7 F (36.5 C) (Oral)   Resp 14   Wt 79.8 kg   SpO2 94%   BMI 26.76 kg/m  Physical Exam Vitals and nursing note reviewed.  Constitutional:      General: He  is not in acute distress.    Appearance: He is well-developed.  HENT:     Head: Normocephalic and atraumatic.     Right Ear: External ear normal.     Left Ear: External ear normal.     Mouth/Throat:     Mouth: Mucous membranes are moist.  Eyes:     General: No scleral icterus.    Extraocular Movements: Extraocular movements intact.     Conjunctiva/sclera: Conjunctivae normal.     Pupils: Pupils are equal, round, and reactive to light.  Cardiovascular:     Rate and Rhythm: Normal rate and regular rhythm.     Pulses: Normal pulses.     Heart sounds: Normal heart sounds.  Pulmonary:     Effort: Pulmonary effort is normal. No respiratory distress.     Breath sounds: Normal breath sounds.  Abdominal:     General: Abdomen is flat.     Palpations: Abdomen is soft.     Tenderness: There is no abdominal tenderness.  Musculoskeletal:     Cervical back: No rigidity.     Right lower leg: No edema.     Left lower leg: No edema.  Skin:    General: Skin is warm and dry.     Capillary Refill: Capillary refill takes less than 2 seconds.  Neurological:     Mental Status: He is alert and oriented to person, place, and time.     GCS: GCS eye subscore is 4. GCS verbal subscore is 5. GCS motor subscore is 6.     Cranial Nerves: Cranial nerves 2-12 are intact. No dysarthria or facial asymmetry.     Sensory: Sensation is intact. No sensory deficit.     Motor: Motor function is intact. No weakness or tremor.     Coordination: Coordination is intact. Coordination normal. Finger-Nose-Finger Test normal.     Gait: Gait is intact.     Comments: Strength 5/5 to BLUE/BLLE, equal and symmetric    Psychiatric:        Mood and Affect: Mood normal.        Behavior: Behavior normal.     ED Results and Treatments Labs (all labs ordered are listed, but only abnormal results are displayed) Labs Reviewed  COMPREHENSIVE METABOLIC PANEL WITH GFR - Abnormal; Notable for the following components:      Result  Value   Glucose, Bld 115 (*)    Total Bilirubin 1.9 (*)  All other components within normal limits  CBC WITH DIFFERENTIAL/PLATELET - Abnormal; Notable for the following components:   WBC 10.9 (*)    Platelets 138 (*)    Neutro Abs 8.1 (*)    All other components within normal limits  SALICYLATE LEVEL - Abnormal; Notable for the following components:   Salicylate Lvl <7.0 (*)    All other components within normal limits  ACETAMINOPHEN  LEVEL - Abnormal; Notable for the following components:   Acetaminophen  (Tylenol ), Serum <10 (*)    All other components within normal limits  CBG MONITORING, ED - Abnormal; Notable for the following components:   Glucose-Capillary 124 (*)    All other components within normal limits  RESP PANEL BY RT-PCR (RSV, FLU A&B, COVID)  RVPGX2  URINALYSIS, ROUTINE W REFLEX MICROSCOPIC  AMMONIA  URINE DRUG SCREEN  CK                                                                                                                          Radiology No results found.  Pertinent labs & imaging results that were available during my care of the patient were reviewed by me and considered in my medical decision making (see MDM for details).  Medications Ordered in ED Medications - No data to display                                                                                                                                   Procedures Procedures  (including critical care time)  Medical Decision Making / ED Course    Medical Decision Making:    Mark Nielsen is a 78 y.o. male with past medical history as below, significant for CAD, prior MI who presents to the ED with complaint of confusion. The complaint involves an extensive differential diagnosis and also carries with it a high risk of complications and morbidity.  Serious etiology was considered. Ddx includes but is not limited to: Differential diagnoses for altered mental status includes but is not  exclusive to alcohol, illicit or prescription medications, intracranial pathology such as stroke, intracerebral hemorrhage, fever or infectious causes including sepsis, hypoxemia, uremia, trauma, endocrine related disorders such as diabetes, hypoglycemia, thyroid -related diseases, etc.   Complete initial physical exam performed, notably the patient was in no acute distress, nonfocal exam.    Reviewed and confirmed nursing documentation for past medical history, family history, social history.  Vital signs reviewed.    Altered mental status> - 2 episodes in last 24 hours of transient confusion regarding his wife - He does drink alcohol daily, 1-2 beers; was advised to start thiamine , folic acid  and B12 yesterday, has not started - Upon further history collection with the spouse patient has been having intermittent episodes of agitation over the past few months, intermittent memory loss. - Patient seems to have gradually worsening confusion - story sounds most compelling for patient developing some degree of dementia as it seems to be more of a progressive process rather than acute delirium - Patient does not want to stay for any further testing, will give referral to neurology, advised to cut back on alcohol use and to take medications as prescribed.  Patient in no distress and overall condition is stable. Detailed discussions were had with the patient/guardian regarding current findings, and need for close f/u with PCP or on call doctor. The patient/guardian has been instructed to return immediately if the symptoms worsen in any way for re-evaluation. Patient/guardian verbalized understanding and is in agreement with current care plan. All questions answered prior to discharge.                     Additional history obtained: -Additional history obtained from spouse -External records from outside source obtained and reviewed including: Chart review including previous notes, labs,  imaging, consultation notes including  Recent ER evaluation, prior labs and imaging   Lab Tests: -I ordered, reviewed, and interpreted labs.   The pertinent results include:   Labs Reviewed  COMPREHENSIVE METABOLIC PANEL WITH GFR - Abnormal; Notable for the following components:      Result Value   Glucose, Bld 115 (*)    Total Bilirubin 1.9 (*)    All other components within normal limits  CBC WITH DIFFERENTIAL/PLATELET - Abnormal; Notable for the following components:   WBC 10.9 (*)    Platelets 138 (*)    Neutro Abs 8.1 (*)    All other components within normal limits  SALICYLATE LEVEL - Abnormal; Notable for the following components:   Salicylate Lvl <7.0 (*)    All other components within normal limits  ACETAMINOPHEN  LEVEL - Abnormal; Notable for the following components:   Acetaminophen  (Tylenol ), Serum <10 (*)    All other components within normal limits  CBG MONITORING, ED - Abnormal; Notable for the following components:   Glucose-Capillary 124 (*)    All other components within normal limits  RESP PANEL BY RT-PCR (RSV, FLU A&B, COVID)  RVPGX2  URINALYSIS, ROUTINE W REFLEX MICROSCOPIC  AMMONIA  URINE DRUG SCREEN  CK    Notable for labs are stable  EKG   EKG Interpretation Date/Time:  Monday May 11 2024 17:59:48 EDT Ventricular Rate:  71 PR Interval:  180 QRS Duration:  82 QT Interval:  398 QTC Calculation: 433 R Axis:   29  Text Interpretation: Sinus rhythm Atrial premature complex Confirmed by Elnor Savant ([id number]) on 05/11/2024 6:13:23 PM         Imaging Studies ordered: na   Medicines ordered and prescription drug management: No orders of the defined types were placed in this encounter.   -I have reviewed the patients home medicines and have made adjustments as needed   Consultations Obtained: I did initially send consult for neurology but have not received callback, patient does not want to wait any longer and wants to leave   Cardiac  Monitoring: Continuous pulse oximetry interpreted by myself,  97% on ra.    Social Determinants of Health:  Diagnosis or treatment significantly limited by social determinants of health: alcohol use   Reevaluation: After the interventions noted above, I reevaluated the patient and found that they have improved  Co morbidities that complicate the patient evaluation  Past Medical History:  Diagnosis Date   Coronary artery disease    Myocardial infarct [facility])       Dispostion: Disposition decision including need for hospitalization was considered, and patient discharged from emergency department.    Final Clinical Impression(s) / ED Diagnoses Final diagnoses:  Confusion        Elnor Jayson LABOR, DO 05/11/24 2015

## 2024-05-11 NOTE — ED Notes (Signed)
 Assisted pt to BR Pt eager to leave and go home Explained to pt and wife EDP is awaiting a call from neurology.

## 2024-05-11 NOTE — Progress Notes (Signed)
 Contacted patients wife, reports patient is worse today. States he was fine this morning but now he is worse. States she was told to try to get him to take vitamins and he flipped and thought I was trying to hurt him. States he is disoriented and does not know who she is. When patient was at the ED yesterday CT scan did not show any infarct. Patients wife states they did not do a urine sample while there but blood work was good. Patient wife is requesting an ED F/U and states does not want to go back to the ED. Patients wife reports he had a UTI with Ecoli a few years ago and patient became confused and irritable like he is now. States she does not think he will go to UC either.

## 2024-05-11 NOTE — Discharge Instructions (Signed)
 It was a pleasure caring for you today in the emergency department.  Be sure to take medications as prescribed.  Recommend you cut back on your alcohol use.  Please follow-up with neurology in the office for further testing  Please return to the emergency department for any worsening or worrisome symptoms.

## 2024-05-11 NOTE — Progress Notes (Signed)
 Contacted patients wife with provider recommendations. Patient states she will take him back to the ED. Patients wife states she is not sure she can get him to go but she will try and verbalized understanding.

## 2024-05-11 NOTE — ED Triage Notes (Signed)
 Pt wife reports an episode of confusion today. Sent by PCP.  Wife states he is unable to recognize family during episodes. Not currently in episode.  Pt is A&Ox4  Denies dizziness, headache, visual changes.   Seen yesterday for same issue, episodes continuing to occur

## 2024-05-11 NOTE — Progress Notes (Signed)
 [facility] Transitional Care Management     Situation   ED VISIT ONLY   Mark Nielsen is a 78 y.o. male who was contacted today for a transitional care outreach.  Admission Date: 05/10/24  Discharge Date:05/10/24   [facility]stitution: [facility]   Diagnosis:  confusion   Is this visit eligible for TCM? No  Background   Since Discharge: Spoke with patient's wife who reports patient is doing better today, has not had any episodes of confusion, is at baseline. Patient denies any headaches or dizziness. Wife states the ED provider prescribed vitamins for patient, she will pick them up today. HN will send a message to PCP office for an appt; wife is aware. Wife reports patient eats, but does not have a good appetite; patient does not drink enough fluids but wife states patient is working on drinking more water during the day.   Primary Care Provider on Record: Cole Christopher Podraza, PA-C   Assessment    General Assessment     None           Recommendation    PCP/specialist notified: Yes; appt requested.   Referral Made: No  Referrals made to other disciplines: None   Future Appointments  Date Time Provider Department Center  05/28/2024  7:30 AM Shanna JINNY Denmark, MD [facility] [address] [facility]  07/24/2024  9:40 AM Rosalva Christopher Podraza, PA-C [facility]        Electronically signed by: Suzen GORMAN Sharps 05/11/2024 11:19 AM

## 2024-05-11 NOTE — Progress Notes (Signed)
 Needs ED follow-up appt  Suzen Sharps, RN, BSN [facility]  [phone]

## 2024-05-12 LAB — VITAMIN B1

## 2024-05-13 ENCOUNTER — Encounter: Payer: Self-pay | Admitting: Neurology

## 2024-05-13 ENCOUNTER — Ambulatory Visit: Payer: Self-pay | Admitting: Neurology

## 2024-05-13 ENCOUNTER — Telehealth: Payer: Self-pay | Admitting: Neurology

## 2024-05-13 NOTE — Telephone Encounter (Signed)
 [name] called to cancel 9:45AM appointment today at 9:04AM. Patient will be no showed, stated is having anxiety and not feeling well about this, will c/b to r/s if needed

## 2024-05-13 NOTE — Telephone Encounter (Signed)
 Noted
